# Patient Record
Sex: Male | Born: 1980 | Race: White | Hispanic: No | Marital: Married | Smoking: Never smoker
Health system: Southern US, Community
[De-identification: ages and names within clinical notes are randomized; demographics above are authoritative.]

---
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
## 2012-01-01 ENCOUNTER — Encounter (HOSPITAL_BASED_OUTPATIENT_CLINIC_OR_DEPARTMENT_OTHER): Payer: Self-pay | Admitting: *Deleted

## 2012-01-01 ENCOUNTER — Emergency Department (HOSPITAL_BASED_OUTPATIENT_CLINIC_OR_DEPARTMENT_OTHER): Payer: 59

## 2012-01-01 ENCOUNTER — Emergency Department (HOSPITAL_BASED_OUTPATIENT_CLINIC_OR_DEPARTMENT_OTHER)
Admission: EM | Admit: 2012-01-01 | Discharge: 2012-01-01 | Disposition: A | Discharge status: Home / Self Care | Payer: 59 | Attending: Emergency Medicine | Admitting: Emergency Medicine

## 2012-01-01 DIAGNOSIS — Y9375 Activity, martial arts: Secondary | ICD-10-CM | POA: Insufficient documentation

## 2012-01-01 DIAGNOSIS — Z794 Long term (current) use of insulin: Secondary | ICD-10-CM | POA: Insufficient documentation

## 2012-01-01 DIAGNOSIS — W219XXA Striking against or struck by unspecified sports equipment, initial encounter: Secondary | ICD-10-CM | POA: Insufficient documentation

## 2012-01-01 DIAGNOSIS — S2239XA Fracture of one rib, unspecified side, initial encounter for closed fracture: Secondary | ICD-10-CM | POA: Insufficient documentation

## 2012-01-01 DIAGNOSIS — E119 Type 2 diabetes mellitus without complications: Secondary | ICD-10-CM | POA: Insufficient documentation

## 2012-01-01 MED ORDER — HYDROCODONE-ACETAMINOPHEN 5-500 MG PO TABS: 1.0000 | ORAL_TABLET | Freq: Four times a day (QID) | ORAL | Status: AC | PRN
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
## 2012-01-01 NOTE — Discharge Instructions (Signed)
Rib Fracture Your caregiver has diagnosed you as having a rib fracture (a break). This can occur by a blow to the chest, by a fall against a hard object, or by violent coughing or sneezing. There may be one or many breaks. Rib fractures may heal on their own within 3 to 8 weeks. The longer healing period is usually associated with a continued cough or other aggravating activities. HOME CARE INSTRUCTIONS   Avoid strenuous activity. Be careful during activities and avoid bumping the injured rib. Activities that cause pain pull on the fracture site(s) and are best avoided if possible.   Eat a normal, well-balanced diet. Drink plenty of fluids to avoid constipation.   Take deep breaths several times a day to keep lungs free of infection. Try to cough several times a day, splinting the injured area with a pillow. This will help prevent pneumonia.   Do not wear a rib belt or binder. These restrict breathing which can lead to pneumonia.   Only take over-the-counter or prescription medicines for pain, discomfort, or fever as directed by your caregiver.  SEEK MEDICAL CARE IF:  You develop a continual cough, associated with thick or bloody sputum. SEEK IMMEDIATE MEDICAL CARE IF:   You have a fever.   You have difficulty breathing.   You have nausea (feeling sick to your stomach), vomiting, or abdominal (belly) pain.   You have worsening pain, not controlled with medications.  Document Released: 05/22/2005 Document Revised: 05/11/2011 Document Reviewed: 10/24/2006 [employer] Patient Information 2012 [employer]. 

## 2012-01-07 NOTE — ED Provider Notes (Signed)
Medical screening examination/treatment/procedure(s) were performed by non-physician practitioner and as supervising physician I was immediately available for consultation/collaboration.  Airianna Kreischer, MD 01/07/12 1501 
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
## 2017-06-01 ENCOUNTER — Ambulatory Visit (INDEPENDENT_AMBULATORY_CARE_PROVIDER_SITE_OTHER): Payer: Self-pay | Admitting: Ophthalmology

## 2017-06-01 ENCOUNTER — Encounter (INDEPENDENT_AMBULATORY_CARE_PROVIDER_SITE_OTHER): Payer: Self-pay | Admitting: Ophthalmology

## 2017-06-01 DIAGNOSIS — H5213 Myopia, bilateral: Secondary | ICD-10-CM

## 2017-06-01 DIAGNOSIS — E103293 Type 1 diabetes mellitus with mild nonproliferative diabetic retinopathy without macular edema, bilateral: Secondary | ICD-10-CM

## 2017-06-01 NOTE — Progress Notes (Signed)
[facility] - Clinic Note  06/01/2017     CHIEF COMPLAINT Patient presents for Diabetic Eye Exam   HISTORY OF PRESENT ILLNESS: Ralph Hall is a 36 y.o. male who presents to the clinic today for:   HPI    Diabetic Eye Exam    Vision is stable.  Associated Symptoms Flashes.  Negative for Blind Spot, Photophobia, Scalp Tenderness, Fever, Floaters, Pain, Glare, Jaw Claudication, Weight Loss, Distortion, Redness, Trauma, Shoulder/Hip pain and Fatigue.  Diabetes characteristics include Type 1 and on insulin.  This started 18 years ago.  Blood sugar level is controlled.  Last Blood Glucose 220 (Pt has been sick for the past 2 weeks).  Last A1C 6.2.  I, the attending physician,  performed the HPI with the patient and updated documentation appropriately.          Comments    Pt presents today for a diabetic eye exam, pt states he was dx 18 years ago when he was 36 years old, pt is on insulin and states blood sugar is controlled, his last A1C was 6.2 and blood sugar was 220, but pt states he has been sick for the past 2 weeks, pt states vision is stable, denies floaters, wavy vision or pain and states he sees "flairs" only when his blood sugar drops quickly,       Last edited by Rennis ChrisZamora, Halleigh Comes, MD on 06/01/2017  2:17 PM. (History)      Referring physician: No referring provider defined for this encounter.  HISTORICAL INFORMATION:   Selected notes from the MEDICAL RECORD NUMBER    CURRENT MEDICATIONS: No current outpatient medications on file. (Ophthalmic Drugs)   No current facility-administered medications for this visit.  (Ophthalmic Drugs)   Current Outpatient Medications (Other)  Medication Sig  . cetirizine (ZYRTEC) 10 MG tablet Take 10 mg by mouth daily.  . insulin glargine (LANTUS) 100 UNIT/ML injection Inject 20 Units into the skin at bedtime.   . insulin lispro (HUMALOG) 100 UNIT/ML injection Inject 20-25 Units into the skin 3 (three) times daily  before meals.   . Multiple Vitamin (MULTIVITAMIN) tablet Take 1 tablet by mouth daily.  . naproxen sodium (ANAPROX) 220 MG tablet Take 220 mg by mouth 2 (two) times daily with a meal. For pain.   No current facility-administered medications for this visit.  (Other)      REVIEW OF SYSTEMS: ROS    Positive for: Endocrine, Eyes   Negative for: Constitutional, Gastrointestinal, Neurological, Skin, Genitourinary, Musculoskeletal, HENT, Cardiovascular, Respiratory, Psychiatric, Allergic/Imm, Heme/Lymph   Last edited by Posey BoyerBrown, Amanda J, COT on 06/01/2017  1:37 PM. (History)       ALLERGIES No Known Allergies  PAST MEDICAL HISTORY Past Medical History:  Diagnosis Date  . Diabetes mellitus    History reviewed. No pertinent surgical history.  FAMILY HISTORY Family History  Problem Relation Age of Onset  . Amblyopia Neg Hx   . Blindness Neg Hx   . Cancer Neg Hx   . Cataracts Neg Hx   . Glaucoma Neg Hx   . Hypertension Neg Hx   . Macular degeneration Neg Hx   . Retinal detachment Neg Hx   . Strabismus Neg Hx   . Retinitis pigmentosa Neg Hx     SOCIAL HISTORY Social History   Tobacco Use  . Smoking status: Never Smoker  . Smokeless tobacco: Never Used  Substance Use Topics  . Alcohol use: No  . Drug use: No  OPHTHALMIC EXAM:  Base Eye Exam    Visual Acuity (Snellen - Linear)      Right Left   Dist cc 20/20 20/20   Correction:  Glasses       Tonometry (Tonopen, 2:09 PM)      Right Left   Pressure 19 16       Pupils      Dark Light Shape React APD   Right 4 2 Round Brisk None   Left 4 2 Round Brisk None       Visual Fields (Counting fingers)      Left Right    Full        Extraocular Movement      Right Left    Full, Ortho Full, Ortho       Neuro/Psych    Oriented x3:  Yes   Mood/Affect:  Normal       Dilation    Both eyes:  1.0% Mydriacyl, 2.5% Phenylephrine @ 2:09 PM        Slit Lamp and Fundus Exam    External Exam       Right Left   External Normal Normal       Slit Lamp Exam      Right Left   Lids/Lashes Normal Normal   Conjunctiva/Sclera White and quiet White and quiet   Cornea Clear Clear   Anterior Chamber Deep and quiet Deep and quiet   Iris Round and dilated, No NVI Round and dilated, No NVI   Lens Trace Nuclear sclerosis, Trace Cortical cataract Trace Nuclear sclerosis, Trace Cortical cataract   Vitreous Trace Vitreous syneresis Trace Vitreous syneresis, partial Posterior vitreous detachment       Fundus Exam      Right Left   Disc Normal, No NVD Normal, No NVD   C/D Ratio 0.4 0.4   Macula Good foveal reflex, No heme or edema Good foveal reflex, Few Inferior Microaneurysms, No edema   Vessels Normal Normal   Periphery Scattered MAs, Attached Attached, pigmented chorioretinal atrophy at 0430--no SRF; scattered MAs        Refraction    Wearing Rx      Sphere Cylinder   Right -5.25 Sphere   Left -5.25 Sphere       Manifest Refraction      Sphere Cylinder   Right -5.75 Sphere   Left -5.25 Sphere       Final Rx      Sphere Cylinder   Right -5.75 Sphere   Left -5.25 Sphere          IMAGING AND PROCEDURES  Imaging and Procedures for 06/01/17  Right Eye:  Central foveal thickness: 290 Findings: NFP; no IRF/SRF; tr ERM Comparison to previous: not applicable   Left Eye:  Central foveal thickness: 286 Findings: NFP; no IRF/SRF; tr ERM Comparison to previous: not applicable   Diagnosis / Impression:  No DME OU Tr ERM OU  Clinical management:  See below  Abbreviations: NFP - Normal foveal profile. CME - cystoid macular edema. PED - pigment epithelial detachment. IRF - intraretinal fluid. SRF - subretinal fluid. EZ - ellipsoid zone. ERM - epiretinal membrane. ORA - outer retinal atrophy. ORT - outer retinal tubulation. SRHM - subretinal hyper-reflective material         ASSESSMENT/PLAN:    ICD-10-CM   1. 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 Myopia of both eyes H52.13  1. 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   - The need for close monitoring of blood glucose, blood pressure, and serum lipids, avoiding cigarette or any type of tobacco, and the need for long term follow up was also discussed with patient. - exam shows scattered MAs OU without NV - OCT without diabetic macular edema, both eyes  - f/u in 6 mos  2. Myopia OU  - VA 20/20 in current glasses - continue current glasses and contacts   Ophthalmic Meds Ordered this visit:  No orders of the defined types were placed in this encounter.      Return in about 6 months (around 11/30/2017) for Dilated Exam, OCT.  There are no Patient Instructions on file for this visit.   Explained the diagnoses, plan, and follow up with the patient and they expressed understanding.  Patient expressed understanding of the importance of proper follow up care.   Karie ChimeraBrian G. Yasmene Salomone, M.D., Ph.D. Diseases & Surgery of the Retina and Vitreous [facility] 06/01/17     Abbreviations: M myopia (nearsighted); A astigmatism; H hyperopia (farsighted); P presbyopia; Mrx spectacle prescription;  CTL contact lenses; OD right eye; OS left eye; OU both eyes  XT exotropia; ET esotropia; PEK punctate epithelial keratitis; PEE punctate epithelial erosions; DES dry eye syndrome; MGD meibomian gland dysfunction; ATs artificial tears; PFAT's preservative free artificial tears; NSC nuclear sclerotic cataract; PSC posterior subcapsular cataract; ERM epi-retinal membrane; PVD posterior vitreous detachment; RD retinal detachment; DM diabetes mellitus; DR diabetic retinopathy; NPDR non-proliferative diabetic retinopathy; PDR proliferative diabetic retinopathy;  CSME clinically significant macular edema; DME diabetic macular edema; dbh dot blot hemorrhages; CWS cotton wool spot; POAG primary open angle glaucoma; C/D cup-to-disc ratio; HVF humphrey visual field; GVF goldmann visual field; OCT optical coherence tomography; IOP intraocular pressure; BRVO Branch retinal vein occlusion; CRVO central retinal vein occlusion; CRAO central retinal artery occlusion; BRAO branch retinal artery occlusion; RT retinal tear; SB scleral buckle; PPV pars plana vitrectomy; VH Vitreous hemorrhage; PRP panretinal laser photocoagulation; IVK intravitreal kenalog; VMT vitreomacular traction; MH Macular hole;  NVD neovascularization of the disc; NVE neovascularization elsewhere; AREDS age related eye disease study; ARMD age related macular degeneration; POAG primary open angle glaucoma; EBMD epithelial/anterior basement membrane dystrophy; ACIOL anterior chamber intraocular lens; IOL intraocular lens; PCIOL posterior chamber intraocular lens; Phaco/IOL phacoemulsification with intraocular lens placement; PRK photorefractive keratectomy; LASIK laser assisted in situ keratomileusis; HTN hypertension; DM diabetes mellitus; COPD chronic obstructive pulmonary disease

## 2017-06-01 NOTE — Progress Notes (Signed)
This document serves as a record of services personally performed by Karie ChimeraBrian G. Zamora, MD, PhD. It was created on their behalf by Virgilio BellingMeredith Fabian, COA, a certified ophthalmic assistant. The creation of this record is the provider's dictation and/or activities during the visit.  Electronically signed by: Virgilio BellingMeredith Fabian, COA  06/01/17 2:31 PM  I have reviewed the above documentation for accuracy and completeness, and I agree with the above. Karie ChimeraBrian G. Zamora, M.D., Ph.D. 06/01/17 3:02 PM

## 2023-03-09 ENCOUNTER — Encounter (INDEPENDENT_AMBULATORY_CARE_PROVIDER_SITE_OTHER): Payer: 59 | Admitting: Ophthalmology

## 2023-03-09 ENCOUNTER — Ambulatory Visit (INDEPENDENT_AMBULATORY_CARE_PROVIDER_SITE_OTHER): Payer: 59 | Admitting: Ophthalmology

## 2023-03-09 ENCOUNTER — Encounter (INDEPENDENT_AMBULATORY_CARE_PROVIDER_SITE_OTHER): Payer: Self-pay | Admitting: Ophthalmology

## 2023-03-09 DIAGNOSIS — E103293 Type 1 diabetes mellitus with mild nonproliferative diabetic retinopathy without macular edema, bilateral: Secondary | ICD-10-CM

## 2023-03-09 DIAGNOSIS — Z794 Long term (current) use of insulin: Secondary | ICD-10-CM

## 2023-03-09 DIAGNOSIS — H5213 Myopia, bilateral: Secondary | ICD-10-CM

## 2023-03-09 NOTE — Progress Notes (Signed)
[facility] - Clinic Note  03/09/2023     CHIEF COMPLAINT Patient presents for Retina Follow Up   HISTORY OF PRESENT ILLNESS: Ralph Hall is a 42 y.o. male who presents to the clinic today for:   HPI     Retina Follow Up   Patient presents with  Diabetic Retinopathy.  In both eyes.  This started 6 months ago.  I, the attending physician,  performed the HPI with the patient and updated documentation appropriately.        Comments   Patient here for  6 months retina follow up for NPDR OU. Patient states vision is ok. Wears contact lenses. Trouble reading with contacts in. When takes contacts out sees to read fine.       Last edited by Rennis Chris, MD on 03/10/2023 12:06 AM.    Pt states he is having a hard time reading labels, he feels like he can see better up close with out his CL, pt states his blood sugar is "as good as it can be", he states he takes insulin, but eats the same things and work out every day  Referring physician: Warden Fillers, MD [address] [place],  [place] [address]  HISTORICAL INFORMATION:   Selected notes from the MEDICAL RECORD NUMBER    CURRENT MEDICATIONS: No current outpatient medications on file. (Ophthalmic Drugs)   No current facility-administered medications for this visit. (Ophthalmic Drugs)   Current Outpatient Medications (Other)  Medication Sig   cetirizine (ZYRTEC) 10 MG tablet Take 10 mg by mouth daily.   insulin glargine (LANTUS) 100 UNIT/ML injection Inject 20 Units into the skin at bedtime.    insulin lispro (HUMALOG) 100 UNIT/ML injection Inject 20-25 Units into the skin 3 (three) times daily before meals.    Multiple Vitamin (MULTIVITAMIN) tablet Take 1 tablet by mouth daily.   naproxen sodium (ANAPROX) 220 MG tablet Take 220 mg by mouth 2 (two) times daily with a meal. For pain.   No current facility-administered medications for this visit. (Other)   REVIEW OF SYSTEMS: ROS    Positive for: Endocrine, Eyes Negative for: Constitutional, Gastrointestinal, Neurological, Skin, Genitourinary, Musculoskeletal, HENT, Cardiovascular, Respiratory, Psychiatric, Allergic/Imm, Heme/Lymph Last edited by Laddie Aquas, COA on 03/09/2023  9:42 AM.     ALLERGIES No Known Allergies  PAST MEDICAL HISTORY Past Medical History:  Diagnosis Date   Diabetes mellitus    History reviewed. No pertinent surgical history.  FAMILY HISTORY Family History  Problem Relation Age of Onset   Amblyopia Neg Hx    Blindness Neg Hx    Cancer Neg Hx    Cataracts Neg Hx    Glaucoma Neg Hx    Hypertension Neg Hx    Macular degeneration Neg Hx    Retinal detachment Neg Hx    Strabismus Neg Hx    Retinitis pigmentosa Neg Hx     SOCIAL HISTORY Social History   Tobacco Use   Smoking status: Never   Smokeless tobacco: Never  Vaping Use   Vaping status: Never Used  Substance Use Topics   Alcohol use: No   Drug use: No       OPHTHALMIC EXAM:  Base Eye Exam     Visual Acuity (Snellen - Linear)       Right Left   Dist cc 20/20 20/20    Correction: Contacts         Tonometry (Tonopen, 9:38 AM)  Right Left   Pressure 21 17         Pupils       Dark Light Shape React APD   Right 4 3 Round Brisk None   Left 4 3 Round Brisk None         Visual Fields (Counting fingers)       Left Right    Full Full         Extraocular Movement       Right Left    Full, Ortho Full, Ortho         Neuro/Psych     Oriented x3: Yes   Mood/Affect: Normal         Dilation     Both eyes: 1.0% Mydriacyl, 2.5% Phenylephrine @ 9:38 AM           Slit Lamp and Fundus Exam     External Exam       Right Left   External Normal Normal         Slit Lamp Exam       Right Left   Lids/Lashes Normal Normal   Conjunctiva/Sclera White and quiet White and quiet   Cornea Clear Clear   Anterior Chamber Deep and quiet Deep and quiet   Iris Round and  dilated, No NVI Round and dilated, No NVI   Lens Trace Nuclear sclerosis, Trace Cortical cataract Trace Nuclear sclerosis, Trace Cortical cataract   Anterior Vitreous Trace Vitreous syneresis Trace Vitreous syneresis, partial Posterior vitreous detachment         Fundus Exam       Right Left   Disc Pink and Sharp, no NVD Pink and Sharp, no NVD   C/D Ratio 0.4 0.4   Macula Flat, Good foveal reflex, rare MA Flat, Good foveal reflex, RPE mottling, rare MA   Vessels attenuated, mild copper wiring attenuated, mild copper wiring   Periphery Attached, focal MA superior midzone Attached, pigmented chorioretinal atrophy at 0430--no SRF; scattered MAs           Refraction     Wearing Rx       Sphere Cylinder   Right -5.25 Sphere   Left -5.25 Sphere         Final Rx       Sphere Cylinder   Right -5.25 Sphere   Left -5.25 Sphere    Type: SVL         Final Rx #2       Sphere Cylinder   Right -5.00    Left -5.00     Type: CTL   Comments: BC: 8.5, DIA: 14.3            IMAGING AND PROCEDURES  Imaging and Procedures for 06/01/17  OCT, Retina - OU - Both Eyes       Right Eye Quality was good. Central Foveal Thickness: 304. Progression has been stable. Findings include normal foveal contour, no IRF, no SRF, vitreomacular adhesion .   Left Eye Quality was good. Central Foveal Thickness: 298. Progression has been stable. Findings include normal foveal contour, no IRF, no SRF, intraretinal hyper-reflective material, vitreomacular adhesion (Trace cystic changes and IRHM).   Notes *Images captured and stored on drive  Diagnosis / Impression:  NFP, no IRF/SRF OU No DME OU  Clinical management:  See below  Abbreviations: NFP - Normal foveal profile. CME - cystoid macular edema. PED - pigment epithelial detachment. IRF - intraretinal fluid. SRF - subretinal fluid. EZ - ellipsoid zone.  ERM - epiretinal membrane. ORA - outer retinal atrophy. ORT - outer retinal  tubulation. SRHM - subretinal hyper-reflective material. IRHM - intraretinal hyper-reflective material 304           ASSESSMENT/PLAN:    ICD-10-CM   1. 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 Current use of insulin (HCC)  Z79.4     3. Myopia of both eyes  H52.13      1,2. Mild non-proliferative diabetic retinopathy w/o DME, both eyes  - A1c: 8.6 on 05.28.24 - The incidence, risk factors for progression, natural history and treatment options for diabetic retinopathy were discussed with patient.   - The need for close monitoring of blood glucose, blood pressure, and serum lipids, avoiding cigarette or any type of tobacco, and the need for long term follow up was also discussed with patient. - exam shows scattered MAs OU without NV - OCT without diabetic macular edema, both eyes  - f/u in 1 yr, sooner prn -- DFE, OCT  3. Myopia OU  - VA 20/20 in current glasses - continue current glasses and contacts  Ophthalmic Meds Ordered this visit:  No orders of the defined types were placed in this encounter.    Return in about 1 year (around 03/08/2024) for f/u DM exam, DFE, OCT.  There are no Patient Instructions on file for this visit.   Explained the diagnoses, plan, and follow up with the patient and they expressed understanding.  Patient expressed understanding of the importance of proper follow up care.   This document serves as a record of services personally performed by Karie Chimera, MD, PhD. It was created on their behalf by Glee Arvin. Manson Passey, OA an ophthalmic technician. The creation of this record is the provider's dictation and/or activities during the visit.    Electronically signed by: Glee Arvin. Manson Passey, OA 03/10/23 12:10 AM  Karie Chimera, M.D., Ph.D. Diseases & Surgery of the Retina and Vitreous [facility] 10.04.24  I have reviewed the above documentation for accuracy and completeness, and I agree with the above. Karie Chimera, M.D., Ph.D. 03/10/23 12:10 AM   Abbreviations: M myopia (nearsighted); A astigmatism; H hyperopia (farsighted); P presbyopia; Mrx spectacle prescription;  CTL contact lenses; OD right eye; OS left eye; OU both eyes  XT exotropia; ET esotropia; PEK punctate epithelial keratitis; PEE punctate epithelial erosions; DES dry eye syndrome; MGD meibomian gland dysfunction; ATs artificial tears; PFAT's preservative free artificial tears; NSC nuclear sclerotic cataract; PSC posterior subcapsular cataract; ERM epi-retinal membrane; PVD posterior vitreous detachment; RD retinal detachment; DM diabetes mellitus; DR diabetic retinopathy; NPDR non-proliferative diabetic retinopathy; PDR proliferative diabetic retinopathy; CSME clinically significant macular edema; DME diabetic macular edema; dbh dot blot hemorrhages; CWS cotton wool spot; POAG primary open angle glaucoma; C/D cup-to-disc ratio; HVF humphrey visual field; GVF goldmann visual field; OCT optical coherence tomography; IOP intraocular pressure; BRVO Branch retinal vein occlusion; CRVO central retinal vein occlusion; CRAO central retinal artery occlusion; BRAO branch retinal artery occlusion; RT retinal tear; SB scleral buckle; PPV pars plana vitrectomy; VH Vitreous hemorrhage; PRP panretinal laser photocoagulation; IVK intravitreal kenalog; VMT vitreomacular traction; MH Macular hole;  NVD neovascularization of the disc; NVE neovascularization elsewhere; AREDS age related eye disease study; ARMD age related macular degeneration; POAG primary open angle glaucoma; EBMD epithelial/anterior basement membrane dystrophy; ACIOL anterior chamber intraocular  lens; IOL intraocular lens; PCIOL posterior chamber intraocular lens; Phaco/IOL phacoemulsification with intraocular lens placement; PRK photorefractive keratectomy; LASIK laser  assisted in situ keratomileusis; HTN hypertension; DM diabetes mellitus; COPD chronic obstructive pulmonary disease

## 2023-03-10 ENCOUNTER — Encounter (INDEPENDENT_AMBULATORY_CARE_PROVIDER_SITE_OTHER): Payer: Self-pay | Admitting: Ophthalmology

## 2024-03-04 NOTE — Progress Notes (Shared)
 [facility] - Clinic Note  03/07/2024     CHIEF COMPLAINT Patient presents for No chief complaint on file.   HISTORY OF PRESENT ILLNESS: Ralph Hall is a 43 y.o. male who presents to the clinic today for:    Pt states he is having a hard time reading labels, he feels like he can see better up close with out his CL, pt states his blood sugar is as good as it can be, he states he takes insulin, but eats the same things and work out every day  Referring physician: No referring provider defined for this encounter.  HISTORICAL INFORMATION:   Selected notes from the MEDICAL RECORD NUMBER    CURRENT MEDICATIONS: No current outpatient medications on file. (Ophthalmic Drugs)   No current facility-administered medications for this visit. (Ophthalmic Drugs)   Current Outpatient Medications (Other)  Medication Sig   cetirizine (ZYRTEC) 10 MG tablet Take 10 mg by mouth daily.   insulin glargine (LANTUS) 100 UNIT/ML injection Inject 20 Units into the skin at bedtime.    insulin lispro (HUMALOG) 100 UNIT/ML injection Inject 20-25 Units into the skin 3 (three) times daily before meals.    Multiple Vitamin (MULTIVITAMIN) tablet Take 1 tablet by mouth daily.   naproxen sodium (ANAPROX) 220 MG tablet Take 220 mg by mouth 2 (two) times daily with a meal. For pain.   No current facility-administered medications for this visit. (Other)   REVIEW OF SYSTEMS:   ALLERGIES No Known Allergies  PAST MEDICAL HISTORY Past Medical History:  Diagnosis Date   Diabetes mellitus    No past surgical history on file.  FAMILY HISTORY Family History  Problem Relation Age of Onset   Amblyopia Neg Hx    Blindness Neg Hx    Cancer Neg Hx    Cataracts Neg Hx    Glaucoma Neg Hx    Hypertension Neg Hx    Macular degeneration Neg Hx    Retinal detachment Neg Hx    Strabismus Neg Hx    Retinitis pigmentosa Neg Hx     SOCIAL HISTORY Social History   Tobacco Use    Smoking status: Never   Smokeless tobacco: Never  Vaping Use   Vaping status: Never Used  Substance Use Topics   Alcohol use: No   Drug use: No       OPHTHALMIC EXAM:  Not recorded     IMAGING AND PROCEDURES  Imaging and Procedures for 06/01/17          ASSESSMENT/PLAN:  No diagnosis found.  1,2. Mild non-proliferative diabetic retinopathy w/o DME, both eyes  - A1c: 8.6 on 05.28.24 - The incidence, risk factors for progression, natural history and treatment options for diabetic retinopathy were discussed with patient.   - The need for close monitoring of blood glucose, blood pressure, and serum lipids, avoiding cigarette or any type of tobacco, and the need for long term follow up was also discussed with patient. - exam shows scattered MAs OU without NV - OCT without diabetic macular edema, both eyes  - f/u in 1 yr, sooner prn -- DFE, OCT  3. Myopia OU  - VA 20/20 in current glasses - continue current glasses and contacts  Ophthalmic Meds Ordered this visit:  No orders of the defined types were placed in this encounter.    No follow-ups on file.  There are no Patient Instructions on file for this visit.   Explained the diagnoses, plan, and follow up  with the patient and they expressed understanding.  Patient expressed understanding of the importance of proper follow up care.  This document serves as a record of services personally performed by Redell JUDITHANN Hans, MD, PhD. It was created on their behalf by Delon Newness COT, an ophthalmic technician. The creation of this record is the provider's dictation and/or activities during the visit.    Electronically signed by: Delon Newness COT 09.30.2025  9:10 AM   Abbreviations: M myopia (nearsighted); A astigmatism; H hyperopia (farsighted); P presbyopia; Mrx spectacle prescription;  CTL contact lenses; OD right eye; OS left eye; OU both eyes  XT exotropia; ET esotropia; PEK punctate epithelial keratitis;  PEE punctate epithelial erosions; DES dry eye syndrome; MGD meibomian gland dysfunction; ATs artificial tears; PFAT's preservative free artificial tears; NSC nuclear sclerotic cataract; PSC posterior subcapsular cataract; ERM epi-retinal membrane; PVD posterior vitreous detachment; RD retinal detachment; DM diabetes mellitus; DR diabetic retinopathy; NPDR non-proliferative diabetic retinopathy; PDR proliferative diabetic retinopathy; CSME clinically significant macular edema; DME diabetic macular edema; dbh dot blot hemorrhages; CWS cotton wool spot; POAG primary open angle glaucoma; C/D cup-to-disc ratio; HVF humphrey visual field; GVF goldmann visual field; OCT optical coherence tomography; IOP intraocular pressure; BRVO Branch retinal vein occlusion; CRVO central retinal vein occlusion; CRAO central retinal artery occlusion; BRAO branch retinal artery occlusion; RT retinal tear; SB scleral buckle; PPV pars plana vitrectomy; VH Vitreous hemorrhage; PRP panretinal laser photocoagulation; IVK intravitreal kenalog; VMT vitreomacular traction; MH Macular hole;  NVD neovascularization of the disc; NVE neovascularization elsewhere; AREDS age related eye disease study; ARMD age related macular degeneration; POAG primary open angle glaucoma; EBMD epithelial/anterior basement membrane dystrophy; ACIOL anterior chamber intraocular lens; IOL intraocular lens; PCIOL posterior chamber intraocular lens; Phaco/IOL phacoemulsification with intraocular lens placement; PRK photorefractive keratectomy; LASIK laser assisted in situ keratomileusis; HTN hypertension; DM diabetes mellitus; COPD chronic obstructive pulmonary disease

## 2024-03-07 ENCOUNTER — Encounter (INDEPENDENT_AMBULATORY_CARE_PROVIDER_SITE_OTHER): Payer: 59 | Admitting: Ophthalmology

## 2024-03-07 DIAGNOSIS — E103293 Type 1 diabetes mellitus with mild nonproliferative diabetic retinopathy without macular edema, bilateral: Secondary | ICD-10-CM

## 2024-03-07 DIAGNOSIS — H5213 Myopia, bilateral: Secondary | ICD-10-CM

## 2024-03-07 DIAGNOSIS — Z794 Long term (current) use of insulin: Secondary | ICD-10-CM
